# Patient Record
Sex: Male | Born: 1950 | Race: Black or African American | Hispanic: No | Marital: Single
Health system: Southern US, Community
[De-identification: ages and names within clinical notes are randomized; demographics above are authoritative.]

## PROBLEM LIST (undated history)

## (undated) DIAGNOSIS — G4700 Insomnia, unspecified: Secondary | ICD-10-CM

## (undated) DIAGNOSIS — R2681 Unsteadiness on feet: Secondary | ICD-10-CM

## (undated) DIAGNOSIS — D61818 Other pancytopenia: Secondary | ICD-10-CM

## (undated) DIAGNOSIS — I6529 Occlusion and stenosis of unspecified carotid artery: Secondary | ICD-10-CM

## (undated) DIAGNOSIS — I10 Essential (primary) hypertension: Secondary | ICD-10-CM

## (undated) DIAGNOSIS — E119 Type 2 diabetes mellitus without complications: Secondary | ICD-10-CM

## (undated) DIAGNOSIS — I639 Cerebral infarction, unspecified: Secondary | ICD-10-CM

---

## 2021-02-09 ENCOUNTER — Emergency Department (HOSPITAL_COMMUNITY): Payer: Medicare Other

## 2021-02-09 ENCOUNTER — Other Ambulatory Visit: Payer: Self-pay

## 2021-02-09 ENCOUNTER — Encounter (HOSPITAL_COMMUNITY): Payer: Self-pay

## 2021-02-09 ENCOUNTER — Emergency Department (HOSPITAL_COMMUNITY)
Admission: EM | Admit: 2021-02-09 | Discharge: 2021-02-09 | Disposition: A | Discharge status: Home / Self Care | Payer: Medicare Other | Attending: Emergency Medicine | Admitting: Emergency Medicine

## 2021-02-09 DIAGNOSIS — K5909 Other constipation: Secondary | ICD-10-CM

## 2021-02-09 DIAGNOSIS — Z7902 Long term (current) use of antithrombotics/antiplatelets: Secondary | ICD-10-CM | POA: Insufficient documentation

## 2021-02-09 DIAGNOSIS — Y92009 Unspecified place in unspecified non-institutional (private) residence as the place of occurrence of the external cause: Secondary | ICD-10-CM | POA: Diagnosis not present

## 2021-02-09 DIAGNOSIS — S2242XA Multiple fractures of ribs, left side, initial encounter for closed fracture: Secondary | ICD-10-CM | POA: Insufficient documentation

## 2021-02-09 DIAGNOSIS — Z23 Encounter for immunization: Secondary | ICD-10-CM | POA: Diagnosis not present

## 2021-02-09 DIAGNOSIS — R519 Headache, unspecified: Secondary | ICD-10-CM | POA: Insufficient documentation

## 2021-02-09 DIAGNOSIS — S20212A Contusion of left front wall of thorax, initial encounter: Secondary | ICD-10-CM

## 2021-02-09 DIAGNOSIS — S299XXA Unspecified injury of thorax, initial encounter: Secondary | ICD-10-CM | POA: Diagnosis present

## 2021-02-09 DIAGNOSIS — I10 Essential (primary) hypertension: Secondary | ICD-10-CM | POA: Insufficient documentation

## 2021-02-09 DIAGNOSIS — W01198A Fall on same level from slipping, tripping and stumbling with subsequent striking against other object, initial encounter: Secondary | ICD-10-CM | POA: Diagnosis not present

## 2021-02-09 DIAGNOSIS — S0083XA Contusion of other part of head, initial encounter: Secondary | ICD-10-CM

## 2021-02-09 DIAGNOSIS — R0789 Other chest pain: Secondary | ICD-10-CM | POA: Insufficient documentation

## 2021-02-09 DIAGNOSIS — K469 Unspecified abdominal hernia without obstruction or gangrene: Secondary | ICD-10-CM | POA: Diagnosis not present

## 2021-02-09 DIAGNOSIS — W010XXA Fall on same level from slipping, tripping and stumbling without subsequent striking against object, initial encounter: Secondary | ICD-10-CM

## 2021-02-09 DIAGNOSIS — K4090 Unilateral inguinal hernia, without obstruction or gangrene, not specified as recurrent: Secondary | ICD-10-CM

## 2021-02-09 DIAGNOSIS — E119 Type 2 diabetes mellitus without complications: Secondary | ICD-10-CM | POA: Diagnosis not present

## 2021-02-09 DIAGNOSIS — S01312A Laceration without foreign body of left ear, initial encounter: Secondary | ICD-10-CM | POA: Insufficient documentation

## 2021-02-09 DIAGNOSIS — R030 Elevated blood-pressure reading, without diagnosis of hypertension: Secondary | ICD-10-CM

## 2021-02-09 HISTORY — DX: Other pancytopenia: D61.818

## 2021-02-09 HISTORY — DX: Cerebral infarction, unspecified: I63.9

## 2021-02-09 HISTORY — DX: Unsteadiness on feet: R26.81

## 2021-02-09 HISTORY — DX: Type 2 diabetes mellitus without complications: E11.9

## 2021-02-09 HISTORY — DX: Essential (primary) hypertension: I10

## 2021-02-09 HISTORY — DX: Occlusion and stenosis of unspecified carotid artery: I65.29

## 2021-02-09 HISTORY — DX: Insomnia, unspecified: G47.00

## 2021-02-09 LAB — CBC
HCT: 31 % — ABNORMAL LOW (ref 39.0–52.0)
Hemoglobin: 9.9 g/dL — ABNORMAL LOW (ref 13.0–17.0)
MCH: 32.5 pg (ref 26.0–34.0)
MCHC: 31.9 g/dL (ref 30.0–36.0)
MCV: 101.6 fL — ABNORMAL HIGH (ref 80.0–100.0)
Platelets: 255 10*3/uL (ref 150–400)
RBC: 3.05 MIL/uL — ABNORMAL LOW (ref 4.22–5.81)
RDW: 14.7 % (ref 11.5–15.5)
WBC: 4.6 10*3/uL (ref 4.0–10.5)
nRBC: 0 % (ref 0.0–0.2)

## 2021-02-09 LAB — COMPREHENSIVE METABOLIC PANEL
ALT: 16 U/L (ref 0–44)
AST: 19 U/L (ref 15–41)
Albumin: 3.2 g/dL — ABNORMAL LOW (ref 3.5–5.0)
Alkaline Phosphatase: 86 U/L (ref 38–126)
Anion gap: 8 (ref 5–15)
BUN: 15 mg/dL (ref 8–23)
CO2: 22 mmol/L (ref 22–32)
Calcium: 9.5 mg/dL (ref 8.9–10.3)
Chloride: 108 mmol/L (ref 98–111)
Creatinine, Ser: 0.82 mg/dL (ref 0.61–1.24)
GFR, Estimated: >60 mL/min (ref >60)
Glucose, Bld: 102 mg/dL — ABNORMAL HIGH (ref 70–99)
Potassium: 4.1 mmol/L (ref 3.5–5.1)
Sodium: 138 mmol/L (ref 135–145)
Total Bilirubin: 0.5 mg/dL (ref 0.3–1.2)
Total Protein: 7.1 g/dL (ref 6.5–8.1)

## 2021-02-09 LAB — TROPONIN I (HIGH SENSITIVITY): Troponin I (High Sensitivity): 7 ng/L (ref <18)

## 2021-02-09 MED ORDER — IOHEXOL 350 MG/ML SOLN
80.0000 mL | Freq: Once | INTRAVENOUS | Status: AC | PRN
  Administered 2021-02-09: 80 mL via INTRAVENOUS

## 2021-02-09 MED ORDER — TETANUS-DIPHTH-ACELL PERTUSSIS 5-2.5-18.5 LF-MCG/0.5 IM SUSY
0.5000 mL | PREFILLED_SYRINGE | Freq: Once | INTRAMUSCULAR | Status: AC
  Administered 2021-02-09: 0.5 mL via INTRAMUSCULAR
  Filled 2021-02-09: qty 0.5

## 2021-02-09 NOTE — ED Provider Notes (Addendum)
[facility] EMERGENCY DEPARTMENT Provider Note   CSN: [id number] Arrival date & time: 02/09/21  0809     History Chief Complaint  Patient presents with   Chris Perkins is a 70 y.o. male.  Patient c/o fall at home this AM. Per report, had gotten up, ?mid chest pain then, and tripped, fell forward, hitting forehead. No faintness or dizziness prior to fall. No loc w fall. Contusion to left forehead, mild dull pain to area, non radiating. Also contusion w small laceration left ear. Last tetanus unknown. Denies neck or back pain. No current chest pain or discomfort. No sob. No abd pain or nv. Denies focal extremity pain or injury. Is on plavix.   The history is provided by the patient and the EMS personnel.  Fall Pertinent negatives include no chest pain, no abdominal pain and no shortness of breath.      Past Medical History:  Diagnosis Date   Diabetes mellitus without complication (HCC)    Hypertension    Insomnia    Pancytopenia (HCC)    Stenosis of carotid artery    Stroke (HCC)    Unsteady gait     There are no problems to display for this patient.   History reviewed. No pertinent surgical history.     History reviewed. No pertinent family history.  Social History   Tobacco Use   Smoking status: Unknown  Substance Use Topics   Drug use: Not Currently    Home Medications Prior to Admission medications   Not on File    Allergies    Patient has no known allergies.  Review of Systems   Review of Systems  Constitutional:  Negative for fever.  HENT:  Negative for nosebleeds.   Eyes:  Negative for pain and visual disturbance.  Respiratory:  Negative for cough and shortness of breath.   Cardiovascular:  Negative for chest pain and leg swelling.  Gastrointestinal:  Negative for abdominal pain, diarrhea and vomiting.  Genitourinary:  Negative for dysuria and flank pain.  Musculoskeletal:  Negative for back pain and neck pain.   Skin:  Positive for wound. Negative for rash.  Neurological:  Negative for weakness and numbness.  Hematological:        On plavix, no recent abnormal bruising or bleeding  Psychiatric/Behavioral:  Negative for confusion.    Physical Exam Updated Vital Signs BP (!) 149/74   Pulse 62   Temp (!) 97.1 F (36.2 C) (Temporal)   Resp 18   Ht 1.778 m (5\' 10" )   Wt 72.6 kg   SpO2 97%   BMI 22.96 kg/m   Physical Exam Vitals and nursing note reviewed.  Constitutional:      Appearance: Normal appearance. He is well-developed.  HENT:     Head:     Comments: Contusion left forehead. Contusion left ear with small, superficial laceration ~ 5 mm. No auricular hematoma.     Nose: Nose normal.     Mouth/Throat:     Mouth: Mucous membranes are moist.     Pharynx: Oropharynx is clear.  Eyes:     General: No scleral icterus.    Conjunctiva/sclera: Conjunctivae normal.     Pupils: Pupils are equal, round, and reactive to light.  Neck:     Vascular: No carotid bruit.     Trachea: No tracheal deviation.  Cardiovascular:     Rate and Rhythm: Normal rate and regular rhythm.     Pulses: Normal pulses.  Heart sounds: Normal heart sounds. No murmur heard.   No friction rub. No gallop.  Pulmonary:     Effort: Pulmonary effort is normal. No accessory muscle usage or respiratory distress.     Breath sounds: Normal breath sounds.     Comments: Mild left chest wall tenderness (but not as tender as might expect if acute rib fxs), no crepitus, normal chest movement.  Chest:     Chest wall: Tenderness present.  Abdominal:     General: Bowel sounds are normal. There is no distension.     Palpations: Abdomen is soft. There is no mass.     Tenderness: There is abdominal tenderness. There is no guarding or rebound.     Hernia: A hernia is present.     Comments: Mild epigastric tenderness. No abd distension. Large RIH, soft, not tense or tender, not painful.   Genitourinary:    Comments: No cva  tenderness. Musculoskeletal:        General: No swelling or tenderness.     Cervical back: Normal range of motion and neck supple. No rigidity.     Comments: CTLS spine, non tender, aligned, no step off. Good rom bil extremities without pain or focal bony tenderness.   Skin:    General: Skin is warm and dry.     Findings: No rash.  Neurological:     Mental Status: He is alert.     Comments: Alert, speech clear. Moves bil ext purposefully. Left weakness c/w prior cva/baseline.   Psychiatric:        Mood and Affect: Mood normal.    ED Results / Procedures / Treatments   Labs (all labs ordered are listed, but only abnormal results are displayed) Results for orders placed or performed during the hospital encounter of 02/09/21  CBC  Result Value Ref Range   WBC 4.6 4.0 - 10.5 K/uL   RBC 3.05 (L) 4.22 - 5.81 MIL/uL   Hemoglobin 9.9 (L) 13.0 - 17.0 g/dL   HCT 35.5 (L) 73.2 - 20.2 %   MCV 101.6 (H) 80.0 - 100.0 fL   MCH 32.5 26.0 - 34.0 pg   MCHC 31.9 30.0 - 36.0 g/dL   RDW 54.2 70.6 - 23.7 %   Platelets 255 150 - 400 K/uL   nRBC 0.0 0.0 - 0.2 %  Comprehensive metabolic panel  Result Value Ref Range   Sodium 138 135 - 145 mmol/L   Potassium 4.1 3.5 - 5.1 mmol/L   Chloride 108 98 - 111 mmol/L   CO2 22 22 - 32 mmol/L   Glucose, Bld 102 (H) 70 - 99 mg/dL   BUN 15 8 - 23 mg/dL   Creatinine, Ser 6.28 0.61 - 1.24 mg/dL   Calcium 9.5 8.9 - 31.5 mg/dL   Total Protein 7.1 6.5 - 8.1 g/dL   Albumin 3.2 (L) 3.5 - 5.0 g/dL   AST 19 15 - 41 U/L   ALT 16 0 - 44 U/L   Alkaline Phosphatase 86 38 - 126 U/L   Total Bilirubin 0.5 0.3 - 1.2 mg/dL   GFR, Estimated >17 >61 mL/min   Anion gap 8 5 - 15  Troponin I (High Sensitivity)  Result Value Ref Range   Troponin I (High Sensitivity) 7 <18 ng/L   CT Head Wo Contrast  Result Date: 02/09/2021 CLINICAL DATA:  Fall EXAM: CT HEAD WITHOUT CONTRAST TECHNIQUE: Contiguous axial images were obtained from the base of the skull through the vertex  without intravenous contrast. COMPARISON:  None. FINDINGS: Brain: There is atrophy and chronic small vessel disease changes. No acute intracranial abnormality. Specifically, no hemorrhage, hydrocephalus, mass lesion, acute infarction, or significant intracranial injury. Vascular: No hyperdense vessel or unexpected calcification. Skull: No acute calvarial abnormality. Sinuses/Orbits: No acute findings. Mucosal thickening throughout the left paranasal sinuses. Other: None IMPRESSION: Atrophy, chronic microvascular disease. No acute intracranial abnormality. Left chronic sinusitis. Electronically Signed   By: Charlett NoseKevin  Dover M.D.   On: 02/09/2021 09:19   CT Cervical Spine Wo Contrast  Result Date: 02/09/2021 CLINICAL DATA:  Fall EXAM: CT CERVICAL SPINE WITHOUT CONTRAST TECHNIQUE: Multidetector CT imaging of the cervical spine was performed without intravenous contrast. Multiplanar CT image reconstructions were also generated. COMPARISON:  None. FINDINGS: Alignment: Normal Skull base and vertebrae: No acute fracture. No primary bone lesion or focal pathologic process. Soft tissues and spinal canal: No prevertebral fluid or swelling. No visible canal hematoma. Disc levels: Degenerative disc disease, most pronounced at C5-6 and C6-7. Bilateral degenerative facet disease. Upper chest: No acute findings Other: None IMPRESSION: Degenerative disc and facet disease. No acute bony abnormality. Electronically Signed   By: Charlett NoseKevin  Dover M.D.   On: 02/09/2021 09:18   DG Chest Port 1 View  Result Date: 02/09/2021 CLINICAL DATA:  Pain EXAM: PORTABLE CHEST 1 VIEW COMPARISON:  None. FINDINGS: Low lung volumes. Bibasilar opacities. Left lateral 6th and 7th rib fractures noted. No visible effusion or pneumothorax. IMPRESSION: Low lung volumes with bibasilar atelectasis. Left lateral 6th and 7th rib fractures.  No visible pneumothorax. Electronically Signed   By: Charlett NoseKevin  Dover M.D.   On: 02/09/2021 09:00    EKG EKG  Interpretation  Date/Time:  Wednesday February 09 2021 08:11:18 EDT Ventricular Rate:  64 PR Interval:  166 QRS Duration: 84 QT Interval:  386 QTC Calculation: 399 R Axis:   59 Text Interpretation: Sinus rhythm Left ventricular hypertrophy No previous tracing Confirmed by Cathren LaineSteinl, Yaritsa Savarino ([id number]) on 02/09/2021 9:26:23 AM  Radiology CT Head Wo Contrast  Result Date: 02/09/2021 CLINICAL DATA:  Fall EXAM: CT HEAD WITHOUT CONTRAST TECHNIQUE: Contiguous axial images were obtained from the base of the skull through the vertex without intravenous contrast. COMPARISON:  None. FINDINGS: Brain: There is atrophy and chronic small vessel disease changes. No acute intracranial abnormality. Specifically, no hemorrhage, hydrocephalus, mass lesion, acute infarction, or significant intracranial injury. Vascular: No hyperdense vessel or unexpected calcification. Skull: No acute calvarial abnormality. Sinuses/Orbits: No acute findings. Mucosal thickening throughout the left paranasal sinuses. Other: None IMPRESSION: Atrophy, chronic microvascular disease. No acute intracranial abnormality. Left chronic sinusitis. Electronically Signed   By: Charlett NoseKevin  Dover M.D.   On: 02/09/2021 09:19   CT Cervical Spine Wo Contrast  Result Date: 02/09/2021 CLINICAL DATA:  Fall EXAM: CT CERVICAL SPINE WITHOUT CONTRAST TECHNIQUE: Multidetector CT imaging of the cervical spine was performed without intravenous contrast. Multiplanar CT image reconstructions were also generated. COMPARISON:  None. FINDINGS: Alignment: Normal Skull base and vertebrae: No acute fracture. No primary bone lesion or focal pathologic process. Soft tissues and spinal canal: No prevertebral fluid or swelling. No visible canal hematoma. Disc levels: Degenerative disc disease, most pronounced at C5-6 and C6-7. Bilateral degenerative facet disease. Upper chest: No acute findings Other: None IMPRESSION: Degenerative disc and facet disease. No acute bony abnormality.  Electronically Signed   By: Charlett NoseKevin  Dover M.D.   On: 02/09/2021 09:18   CT Abdomen Pelvis W Contrast  Result Date: 02/09/2021 CLINICAL DATA:  70 year old male with abdominal pain EXAM: CT ABDOMEN AND PELVIS WITH CONTRAST  TECHNIQUE: Multidetector CT imaging of the abdomen and pelvis was performed using the standard protocol following bolus administration of intravenous contrast. CONTRAST:  47mL OMNIPAQUE IOHEXOL 350 MG/ML SOLN COMPARISON:  Chest radiograph, concurrent FINDINGS: Lower chest: Coronary calcifications. Small volume left pleural effusion, with adjacent dependent atelectasis. Trace right pleural effusion. Hepatobiliary: Subcentimeter hypodensities within the inferior right hepatic lobe, too small to characterize. No gallstones, gallbladder wall thickening, or biliary dilatation. Pancreas: Unremarkable. No pancreatic ductal dilatation or surrounding inflammatory changes. Spleen: Normal in size without focal abnormality. Adrenals/Urinary Tract: 6 cm exophytic right midpole renal cyst. Smaller, some subcentimeter, bilateral renal cortical hypodensities are too small to adequately characterize but are likely additional cysts. No hydronephrosis. Stomach/Bowel: Nonobstructive small bowel. Cecal herniation into large right inguinal hernia. No evidence of vascular compromise. Nondilated appendix with radiodense appendicoliths. Nondilated colon. Rectal and sigmoid distension of stool Vascular/Lymphatic: Distal aortic and iliac atherosclerosis. No enlarged abdominal or pelvic nodes. Reproductive: Prostate is unremarkable. Other: Bladder herniation into left inguinal hernia. Larger right inguinal hernia, with mention, containing cecum and mesentery. Musculoskeletal: Multilevel left lower anterior, lateral and posterior rib fractures within varying degrees of healing. Multilevel degenerative changes of spine. Left lateral hip and flank soft tissues contusion. IMPRESSION: 1. Large right inguinal hernia with cecal and  mesenteric herniation. No evidence of bowel obstruction or vascular compromise. 2. Multilevel subacute and chronic left lower rib fractures. 3. Left hip and flank soft tissue contusions. Correlate with history of trauma. 4. Rectal and sigmoid distension of stool.  Correlate for impaction. 5. Additional chronic and senescent changes as above. Electronically Signed   By: Roanna Banning MD   On: 02/09/2021 12:16   DG Chest Port 1 View  Result Date: 02/09/2021 CLINICAL DATA:  Pain EXAM: PORTABLE CHEST 1 VIEW COMPARISON:  None. FINDINGS: Low lung volumes. Bibasilar opacities. Left lateral 6th and 7th rib fractures noted. No visible effusion or pneumothorax. IMPRESSION: Low lung volumes with bibasilar atelectasis. Left lateral 6th and 7th rib fractures.  No visible pneumothorax. Electronically Signed   By: Charlett Nose M.D.   On: 02/09/2021 09:00    Procedures .Marland KitchenLaceration Repair  Date/Time: 02/09/2021 10:56 AM Performed by: Cathren Laine, MD Authorized by: Cathren Laine, MD   Consent:    Consent given by:  Patient Laceration details:    Location:  Ear   Ear location:  L ear   Length (cm):  1 Pre-procedure details:    Preparation:  Patient was prepped and draped in usual sterile fashion Exploration:    Wound exploration: wound explored through full range of motion and entire depth of wound visualized     Contaminated: no   Treatment:    Area cleansed with:  Saline   Amount of cleaning:  Standard   Irrigation solution:  Sterile saline   Visualized foreign bodies/material removed: no   Skin repair:    Repair method:  Tissue adhesive Repair type:    Repair type:  Simple Post-procedure details:    Procedure completion:  Tolerated well, no immediate complications   Medications Ordered in ED Medications  Tdap (BOOSTRIX) injection 0.5 mL (0.5 mLs Intramuscular Given 02/09/21 1012)    ED Course  I have reviewed the triage vital signs and the nursing notes.  Pertinent labs & imaging results  that were available during my care of the patient were reviewed by me and considered in my medical decision making (see chart for details).    MDM Rules/Calculators/A&P  Iv ns. Continuous pulse ox and cardiac monitoring. Ecg. Pcxr. Stat labs.   Tetanus im.  Reviewed nursing notes and prior charts for additional history.   Labs reviewed/interpreted by me - chem normal. Trop normal.  CXR reviewed/interpreted by me - no pna. ?left rib fx.   CT reviewed/interpreted by me - no hem.   Recheck pt, no chest pain or discomfort. No sob.   Ear wound cleaned thoroughly w sterile saline/irrigated . Dermabond used to close small wounds.   Recheck, c/o mid to upper abd pain +tenderness. Imaging pending.   CT abd reviewed/interpreted by me - no sbo. Large RIH without acute findings.   Recheck, pt comfortable, pt requests d/c. On recheck abd soft, nt/nd, no pain at hernia site, soft, not tense or tender.  Pt currently appears stable for d/c.   Rec pcp f/u.     Final Clinical Impression(s) / ED Diagnoses Final diagnoses:  None    Rx / DC Orders ED Discharge Orders     None         Cathren Laine, MD 02/09/21 1248

## 2021-02-09 NOTE — ED Notes (Signed)
Patient transported to CT 

## 2021-02-09 NOTE — ED Triage Notes (Signed)
Pt BIB [facility] from [facility] for a mechanical fall at approx 0700. Pt has a hx of stroke with Left side deficits. Prior to his fall staff recalls pt was calling out with CP. Pt was walking around on his bed when he slipped and fell landing on the floor striking Left side of his head. Pt presents with hematoma above Left eye, abrasion to his Left ear and c/o Left leg/knee pain. Per EMS pt was lying on his Left side upon their arrival. Pt is confused at baseline, he is alert to self and location, per staff that is his norm.  Per facility pt is taking a blood thinner.  BP 170/92 HR 68 RR 14 93% RA  CBG 101  20g RAC

## 2021-02-09 NOTE — ED Notes (Signed)
Pt removed his c-collar, refuses to put it back on.

## 2021-02-09 NOTE — Discharge Instructions (Addendum)
It was our pleasure to provide your ER care today - we hope that you feel better.  Fall precautions.   Your xrays show left rib fractures. Dermabond/wound adhesive was used to close your small earlobe lacerations. Take acetaminophen as need for pain.   You abdominal scan shows 'Large right inguinal hernia with cecal and mesenteric herniation.  No evidence of bowel obstruction or vascular compromise', as well as constipation.  For hernia, follow up with general surgeon in the next couple weeks - call office to arrange appointment. Return to ER if area becomes acutely painful or more swollen.  For constipation, make sure to stay well hydrated/drink plenty of fluids, get adequate fiber in diet, and take colace (stool softener) 2x/day and take miralax (laxative) once a day as need.   Follow up with primary care doctor in the next couple weeks - have your blood pressure rechecked then, as it is high today.   Return to ER if worse, new symptoms, fevers, new/severe pain, chest pain, trouble breathing, or other concern.

## 2021-02-09 NOTE — ED Notes (Signed)
This RN trying to assist pt with urinal, he completely missed the urinal. Candise Bowens RN assisted this RN with cleaning pt and full linen change

## 2021-10-29 ENCOUNTER — Encounter (HOSPITAL_COMMUNITY): Payer: Self-pay | Admitting: Emergency Medicine

## 2021-10-29 ENCOUNTER — Emergency Department (HOSPITAL_COMMUNITY): Payer: Medicare Other

## 2021-10-29 ENCOUNTER — Other Ambulatory Visit: Payer: Self-pay

## 2021-10-29 ENCOUNTER — Emergency Department (HOSPITAL_COMMUNITY)
Admission: EM | Admit: 2021-10-29 | Discharge: 2021-10-29 | Disposition: A | Discharge status: Skilled Nursing Facility | Payer: Medicare Other | Attending: Emergency Medicine | Admitting: Emergency Medicine

## 2021-10-29 DIAGNOSIS — Z7902 Long term (current) use of antithrombotics/antiplatelets: Secondary | ICD-10-CM | POA: Diagnosis not present

## 2021-10-29 DIAGNOSIS — M79652 Pain in left thigh: Secondary | ICD-10-CM | POA: Insufficient documentation

## 2021-10-29 DIAGNOSIS — Z7982 Long term (current) use of aspirin: Secondary | ICD-10-CM | POA: Insufficient documentation

## 2021-10-29 DIAGNOSIS — G8929 Other chronic pain: Secondary | ICD-10-CM | POA: Diagnosis not present

## 2021-10-29 DIAGNOSIS — S80212A Abrasion, left knee, initial encounter: Secondary | ICD-10-CM | POA: Diagnosis not present

## 2021-10-29 DIAGNOSIS — S0001XA Abrasion of scalp, initial encounter: Secondary | ICD-10-CM

## 2021-10-29 DIAGNOSIS — S80219A Abrasion, unspecified knee, initial encounter: Secondary | ICD-10-CM

## 2021-10-29 DIAGNOSIS — S80211A Abrasion, right knee, initial encounter: Secondary | ICD-10-CM | POA: Diagnosis not present

## 2021-10-29 DIAGNOSIS — S0990XA Unspecified injury of head, initial encounter: Secondary | ICD-10-CM | POA: Diagnosis present

## 2021-10-29 DIAGNOSIS — W19XXXA Unspecified fall, initial encounter: Secondary | ICD-10-CM

## 2021-10-29 MED ORDER — ACETAMINOPHEN 325 MG PO TABS: 650.0000 mg | ORAL_TABLET | Freq: Once | ORAL | Status: DC

## 2021-10-29 NOTE — ED Notes (Addendum)
Patient BIB [facility] from [facility]. Pt was in wheelchair and started rolling and fell from wheel chair, hit left side of his head ( abrasion and swelling present). VSS. Oriented to baseline. NAD. ? ?

## 2021-10-29 NOTE — Discharge Instructions (Addendum)
Return to the ER if you develop new or worsening headache, vision changes, nausea, vomiting, confusion.  Head CT today was okay though there is a small chance you could develop bleeding in or around your brain due to the Plavix use. ?

## 2021-10-29 NOTE — ED Notes (Signed)
[facility] arrived for transport at this time 

## 2021-10-29 NOTE — ED Notes (Signed)
Attempted to call [facility], no response. Will attempt at a later time.  ?

## 2021-10-29 NOTE — ED Notes (Signed)
Received verbal report from Bailey B RN at this time 

## 2021-10-29 NOTE — ED Notes (Signed)
[facility] called 

## 2021-10-29 NOTE — ED Notes (Addendum)
Hematoma noted to lt side of forehead x 2 from fall earlier today. Abrasions noted to bilat knees ?

## 2021-10-29 NOTE — ED Notes (Signed)
Attempted to call [facility] to provide verbal report however there was no answer ?

## 2021-10-29 NOTE — ED Notes (Signed)
Patient transported to CT 

## 2021-10-29 NOTE — Progress Notes (Signed)
Orthopedic Tech Progress Note ?Patient Details:  ?Yogi Colla ?[birth year] ?[id number] ?Level 2 Trauma  ?Patient ID: Chantry Torchio, male   DOB: 11-06-1950, 71 y.o.   MRN: [id number] ? ?Skarleth Delmonico E Syris Brookens ?10/29/2021, 6:53 PM ? ?

## 2021-10-29 NOTE — ED Provider Notes (Signed)
?[facility] EMERGENCY DEPARTMENT ?Provider Note ? ? ?CSN: [id number] ?Arrival date & time:    ? ?  ? ?History ? ?Chief Complaint  ?Patient presents with  ? Fall  ? ? ?Chris Perkins is a 71 y.o. male. ? ?HPI ?71 year old male presents after a fall out of his wheelchair.  History is initially from EMS who states that he was in the wheelchair and a started rolling and he fell off when it went over a curb.  Hit his head and has a small abrasion.  He is also complaining of bilateral knee pain.  No neck pain, chest pain, syncope.  He has 2 chronic swollen areas to his forehead that are not new but then it abrasion to the left temple area.  He is on Plavix.  This was a level 2 trauma.  Patient also endorses chronic pain in the left thigh. ? ?Home Medications ?Prior to Admission medications   ?Medication Sig Start Date End Date Taking? Authorizing Provider  ?acetaminophen (TYLENOL) 500 MG tablet Take 1,000 mg by mouth in the morning and at bedtime.    [provider]  ?amLODipine (NORVASC) 10 MG tablet Take 10 mg by mouth daily. 01/17/21   [provider]  ?aspirin 81 MG chewable tablet Chew 81 mg by mouth daily.    [provider]  ?cholecalciferol (VITAMIN D) 25 MCG (1000 UNIT) tablet Take 2,000 Units by mouth daily.    [provider]  ?clopidogrel (PLAVIX) 75 MG tablet Take 75 mg by mouth daily. 01/17/21   [provider]  ?folic acid (FOLVITE) 1 MG tablet Take 1 mg by mouth daily. 01/17/21   [provider]  ?gabapentin (NEURONTIN) 300 MG capsule Take 300 mg by mouth 3 (three) times daily. 01/21/21   [provider]  ?metoprolol succinate (TOPROL-XL) 100 MG 24 hr tablet Take 100 mg by mouth daily. 01/17/21   [provider]  ?Multiple Vitamin (MULTIVITAMIN) tablet Take 1 tablet by mouth daily.    [provider]  ?pantoprazole (PROTONIX) 40 MG tablet Take 40 mg by mouth daily. 12/24/20   [provider]   ?sertraline (ZOLOFT) 25 MG tablet Take 25 mg by mouth daily. 01/20/21   [provider]  ?thiamine 100 MG tablet Take 100 mg by mouth daily. 01/19/21   [provider]  ?traZODone (DESYREL) 50 MG tablet Take 50 mg by mouth at bedtime. 01/23/21   [provider]  ?   ? ?Allergies    ?Patient has no known allergies.   ? ?Review of Systems   ?Review of Systems  ?Respiratory:  Negative for shortness of breath.   ?Cardiovascular:  Negative for chest pain.  ?Musculoskeletal:  Positive for arthralgias. Negative for neck pain.  ?Skin:  Positive for wound.  ?Neurological:  Negative for syncope and headaches.  ? ?Physical Exam ?Updated Vital Signs ?BP (!) 165/95   Pulse 71   Temp 98.1 ?F (36.7 ?C)   Resp 19   Ht 5\' 10"  (1.778 m)   Wt 73 kg   SpO2 98%   BMI 23.09 kg/m?  ?Physical Exam ?Vitals and nursing note reviewed.  ?Constitutional:   ?   Appearance: He is well-developed.  ?HENT:  ?   Head: Normocephalic.  ? ?Eyes:  ?   Pupils: Pupils are equal, round, and reactive to light.  ?Cardiovascular:  ?   Rate and Rhythm: Normal rate and regular rhythm.  ?   Pulses:     ?  Posterior tibial pulses are 2+ on the right side and 2+ on the left side.  ?   Heart sounds: Normal heart sounds.  ?Pulmonary:  ?   Effort: Pulmonary effort is normal.  ?   Breath sounds: Normal breath sounds.  ?Abdominal:  ?   Palpations: Abdomen is soft.  ?   Tenderness: There is no abdominal tenderness.  ?Musculoskeletal:  ?   Cervical back: No spinous process tenderness or muscular tenderness.  ?   Left hip: No tenderness.  ?   Right knee: No effusion. Tenderness present.  ?   Left knee: No effusion. Tenderness present.  ?   Comments: Small abrasions to both knees  ?Skin: ?   General: Skin is warm and dry.  ?Neurological:  ?   Mental Status: He is alert.  ? ? ?ED Results / Procedures / Treatments   ?Labs ?(all labs ordered are listed, but only abnormal results are displayed) ?Labs Reviewed - No data to display ? ?EKG ?EKG  Interpretation ? ?Date/Time:  Saturday October 29 2021 18:28:25 EDT ?Ventricular Rate:  71 ?PR Interval:  172 ?QRS Duration: 80 ?QT Interval:  374 ?QTC Calculation: 407 ?R Axis:   51 ?Text Interpretation: Sinus rhythm Atrial premature complex Anteroseptal infarct, old nonspecific T waves similar to Aug 2022 Confirmed by Pricilla LovelessGoldston, Shinita Mac [phone]([id number]) on 10/29/2021 6:40:03 PM ? ?Radiology ?DG Pelvis 1-2 Views ? ?Result Date: 10/29/2021 ?CLINICAL DATA:  Fall.  Fall from wheelchair. EXAM: PELVIS - 1-2 VIEW COMPARISON:  None. FINDINGS: The bilateral sacroiliac common bilateral femoroacetabular and pubic symphysis joint spaces are maintained. Mild superolateral bilateral acetabular degenerative osteophytosis. No acute fracture is seen. No dislocation. A large amount of stool is seen within the rectum. Vascular calcifications. IMPRESSION: No acute fracture. High-grade stool within the rectum. Electronically Signed   By: Neita Garnetonald  Viola M.D.   On: 10/29/2021 18:39  ? ?CT Head Wo Contrast ? ?Result Date: 10/29/2021 ?CLINICAL DATA:  Head trauma, minor (Age >= 65y); Neck trauma (Age >= 65y) EXAM: CT HEAD WITHOUT CONTRAST CT CERVICAL SPINE WITHOUT CONTRAST TECHNIQUE: Multidetector CT imaging of the head and cervical spine was performed following the standard protocol without intravenous contrast. Multiplanar CT image reconstructions of the cervical spine were also generated. RADIATION DOSE REDUCTION: This exam was performed according to the departmental dose-optimization program which includes automated exposure control, adjustment of the mA and/or kV according to patient size and/or use of iterative reconstruction technique. COMPARISON:  02/09/2021 FINDINGS: CT HEAD FINDINGS Brain: No evidence of acute infarction, hemorrhage, hydrocephalus, extra-axial collection or mass lesion/mass effect. Extensive low-density changes within the periventricular and subcortical white matter compatible with chronic microvascular ischemic change. Moderate  diffuse cerebral volume loss. Vascular: Atherosclerotic calcifications involving the large vessels of the skull base. No unexpected hyperdense vessel. Skull: Normal. Negative for fracture or focal lesion. Sinuses/Orbits: Left maxillary, left frontal, left ethmoid air cell mucosal thickening/opacification. Other: Scalp lipomas overlie the frontal bone at the midline and inferior left frontal region. 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 Facet joints are aligned without dislocation or traumatic listhesis. Skull base and vertebrae: No acute fracture. No primary bone lesion or focal pathologic process. Soft tissues and spinal canal: No prevertebral fluid or swelling. No visible canal hematoma. Disc levels: Similar degree moderate multilevel cervical spondylosis. Upper chest: Included lung apices are clear. Other: None. IMPRESSION: CT head: 1. No acute intracranial abnormality. 2. Chronic microvascular ischemic change and moderate diffuse cerebral volume loss. 3. Left-sided paranasal sinus disease. CT cervical  spine: 1. 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 2. No acute cervical spine fracture. 3. Similar degree of moderate multilevel cervical spondylosis. Electronically Signed   By: Duanne Guess D.O.   On: 10/29/2021 18:58  ? ?CT Cervical Spine Wo Contrast ? ?Result Date: 10/29/2021 ?CLINICAL DATA:  Head trauma, minor (Age >= 65y); Neck trauma (Age >= 65y) EXAM: CT HEAD WITHOUT CONTRAST CT CERVICAL SPINE WITHOUT CONTRAST TECHNIQUE: Multidetector CT imaging of the head and cervical spine was performed following the standard protocol without intravenous contrast. Multiplanar CT image reconstructions of the cervical spine were also generated. RADIATION DOSE REDUCTION: This exam was performed according to the departmental dose-optimization program which includes automated exposure control, adjustment of the mA and/or kV according to patient size  and/or use of iterative reconstruction technique. COMPARISON:  02/09/2021 FINDINGS: CT HEAD FINDINGS Brain: No evidence of acute infarction, hemorrhage, hydrocephalus, extra-axial collection or mass lesion/mass effect. Ext

## 2022-05-18 ENCOUNTER — Ambulatory Visit (INDEPENDENT_AMBULATORY_CARE_PROVIDER_SITE_OTHER): Payer: Medicare Other | Admitting: Podiatrist

## 2022-05-18 ENCOUNTER — Encounter: Payer: Self-pay | Admitting: Podiatrist

## 2022-05-18 DIAGNOSIS — M79674 Pain in right toe(s): Secondary | ICD-10-CM | POA: Diagnosis not present

## 2022-05-18 DIAGNOSIS — M79675 Pain in left toe(s): Secondary | ICD-10-CM | POA: Diagnosis not present

## 2022-05-18 DIAGNOSIS — B351 Tinea unguium: Secondary | ICD-10-CM

## 2022-05-18 NOTE — Patient Instructions (Signed)

## 2022-05-18 NOTE — Progress Notes (Signed)
Subjective: Chris Perkins is a 71 y.o. male patient with history of diabetes who presents to office today with a chief concern of long,mildly painful nails  while ambulating in shoes; unable to trim.  Patient denies any new cramping, numbness, burning or tingling in the legs.   Renford Dills, MD  is his primary care provider.  There are no problems to display for this patient.  Current Outpatient Medications on File Prior to Visit  Medication Sig Dispense Refill   acetaminophen (TYLENOL) 500 MG tablet Take 1,000 mg by mouth in the morning and at bedtime.     amLODipine (NORVASC) 10 MG tablet Take 10 mg by mouth daily.     aspirin 81 MG chewable tablet Chew 81 mg by mouth daily.     cholecalciferol (VITAMIN D) 25 MCG (1000 UNIT) tablet Take 2,000 Units by mouth daily.     clopidogrel (PLAVIX) 75 MG tablet Take 75 mg by mouth daily.     folic acid (FOLVITE) 1 MG tablet Take 1 mg by mouth daily.     gabapentin (NEURONTIN) 300 MG capsule Take 300 mg by mouth 3 (three) times daily.     metoprolol succinate (TOPROL-XL) 100 MG 24 hr tablet Take 100 mg by mouth daily.     Multiple Vitamin (MULTIVITAMIN) tablet Take 1 tablet by mouth daily.     pantoprazole (PROTONIX) 40 MG tablet Take 40 mg by mouth daily.     sertraline (ZOLOFT) 25 MG tablet Take 25 mg by mouth daily.     thiamine 100 MG tablet Take 100 mg by mouth daily.     traZODone (DESYREL) 50 MG tablet Take 50 mg by mouth at bedtime.     No current facility-administered medications on file prior to visit.   No Known Allergies    Objective: General: Patient is awake, alert, and oriented x 3 and in no acute distress.  Integument: Skin is warm, dry and supple bilateral. Nails are tender, long, thickened and  dystrophic with subungual debris, consistent with onychomycosis, 1-5 bilateral. No signs of infection. No open lesions or preulcerative lesions present bilateral. Remaining integument unremarkable.  Vasculature:  Dorsalis  Pedis pulse 1/4 bilateral. Posterior Tibial pulse  1/4 bilateral.  Capillary fill time <3 sec 1-5 bilateral. Temperature gradient within normal limits. No varicosities present bilateral. trace edema present left greater than right  Neurology: The patient has decreased sensation measured with a 5.07/10g Semmes Weinstein Monofilament at 1 /5 pedal sites intact bilateral . Vibratory sensation diminished bilateral with tuning fork. No Babinski sign present bilateral.   Musculoskeletal: No symptomatic pedal deformities noted bilateral. Brachymetatarsia fourth metatarsal left foot noted with mild contracture left fourth toe. Muscular strength 5/5 in all lower extremity muscular groups bilateral without pain on range of motion . No tenderness with calf compression bilateral.  Assessment and Plan:   ICD-10-CM   1. Pain due to onychomycosis of toenails of both feet  B35.1    M79.675    M79.674        -Examined patient. -Mechanically debrided all nails 1-5 bilateral using sterile nail nipper and filed with dremel without incident  -Answered all patient questions -Patient to return  in 3 months for at risk foot care -Patient advised to call the office if any problems or questions arise in the meantime.  Delories Heinz, DPM

## 2022-05-23 ENCOUNTER — Ambulatory Visit: Payer: Medicare Other | Admitting: Podiatry

## 2022-08-18 ENCOUNTER — Ambulatory Visit: Payer: Medicare Other | Admitting: Podiatry

## 2022-08-25 ENCOUNTER — Encounter: Payer: Self-pay | Admitting: Podiatry

## 2022-08-25 ENCOUNTER — Ambulatory Visit (INDEPENDENT_AMBULATORY_CARE_PROVIDER_SITE_OTHER): Payer: Medicare Other | Admitting: Podiatry

## 2022-08-25 DIAGNOSIS — D689 Coagulation defect, unspecified: Secondary | ICD-10-CM | POA: Diagnosis not present

## 2022-08-25 DIAGNOSIS — M79674 Pain in right toe(s): Secondary | ICD-10-CM | POA: Diagnosis not present

## 2022-08-25 DIAGNOSIS — B351 Tinea unguium: Secondary | ICD-10-CM | POA: Diagnosis not present

## 2022-08-25 DIAGNOSIS — M79675 Pain in left toe(s): Secondary | ICD-10-CM | POA: Diagnosis not present

## 2022-08-25 NOTE — Progress Notes (Signed)
  Subjective:  Patient ID: Chris Perkins, male    DOB: [birth year],   MRN: [id number]  No chief complaint on file.   72 y.o. male presents for concern of thickened elongated and painful nails that are difficult to trim. Requesting to have them trimmed today. Denies numbness or burning in his legs. He is on blood thinner and at risk for foot care.   PCP:  Seward Carol, MD    . Denies any other pedal complaints. Denies n/v/f/c.   Past Medical History:  Diagnosis Date   Diabetes mellitus without complication ([place])    Hypertension    Insomnia    Pancytopenia ([place])    Stenosis of carotid artery    Stroke ([facility])    Unsteady gait     Objective:  Physical Exam: Vascular: DP/PT pulses 1/4 bilateral. CFT <3 seconds. Absent hair growth on digits. Edema noted to bilateral lower extremities. Xerosis noted bilaterally.  Skin. No lacerations or abrasions bilateral feet. Nails 1-5 bilateral  are thickened discolored and elongated with subungual debris.  Musculoskeletal: MMT 5/5 bilateral lower extremities in DF, PF, Inversion and Eversion. Deceased ROM in DF of ankle joint. Brachymetatarsia noted on left foot with mild contracture of left fourth toe.  Neurological: Sensation intact to light touch. Protective sensation diminished bilateral.    Assessment:   1. Pain due to onychomycosis of toenails of both feet   2. Coagulation defect ([place])      Plan:  Patient was evaluated and treated and all questions answered. -Discussed and educated patient on  foot care, especially with  regards to the vascular, neurological and musculoskeletal systems.  -Discussed supportive shoes at all times and checking feet regularly.  -Mechanically debrided all nails 1-5 bilateral using sterile nail nipper and filed with dremel without incident  -Answered all patient questions -Patient to return  in 3 months for at risk foot care -Patient advised to call the office if any problems or questions arise in the  meantime.   Lorenda Peck, DPM

## 2022-09-22 IMAGING — DX DG KNEE COMPLETE 4+V*L*
4 series · 4 of 4 positions shown · non-contrast
Comparison: None.

CLINICAL DATA: Status post fall with knee pain.

EXAM:
LEFT KNEE - COMPLETE 4+ VIEW

[knee ap]
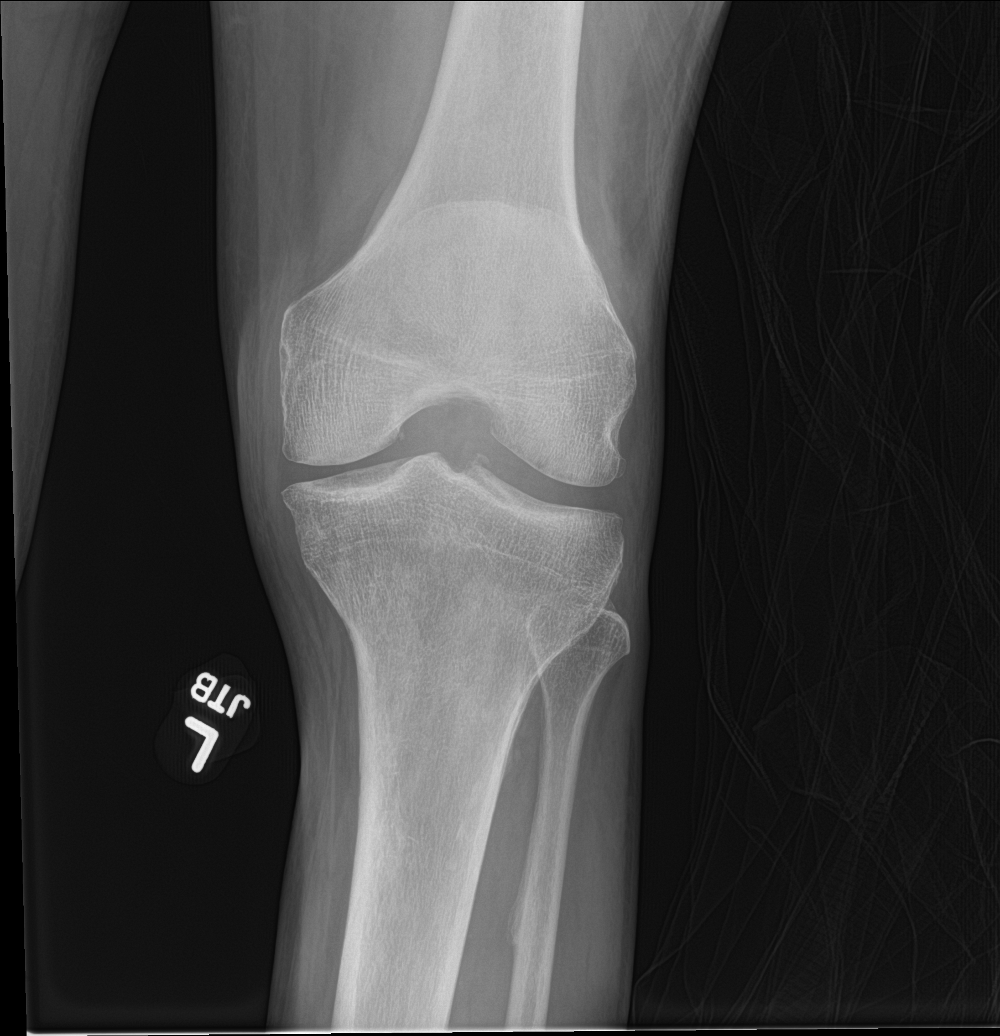

[knee tunnel]
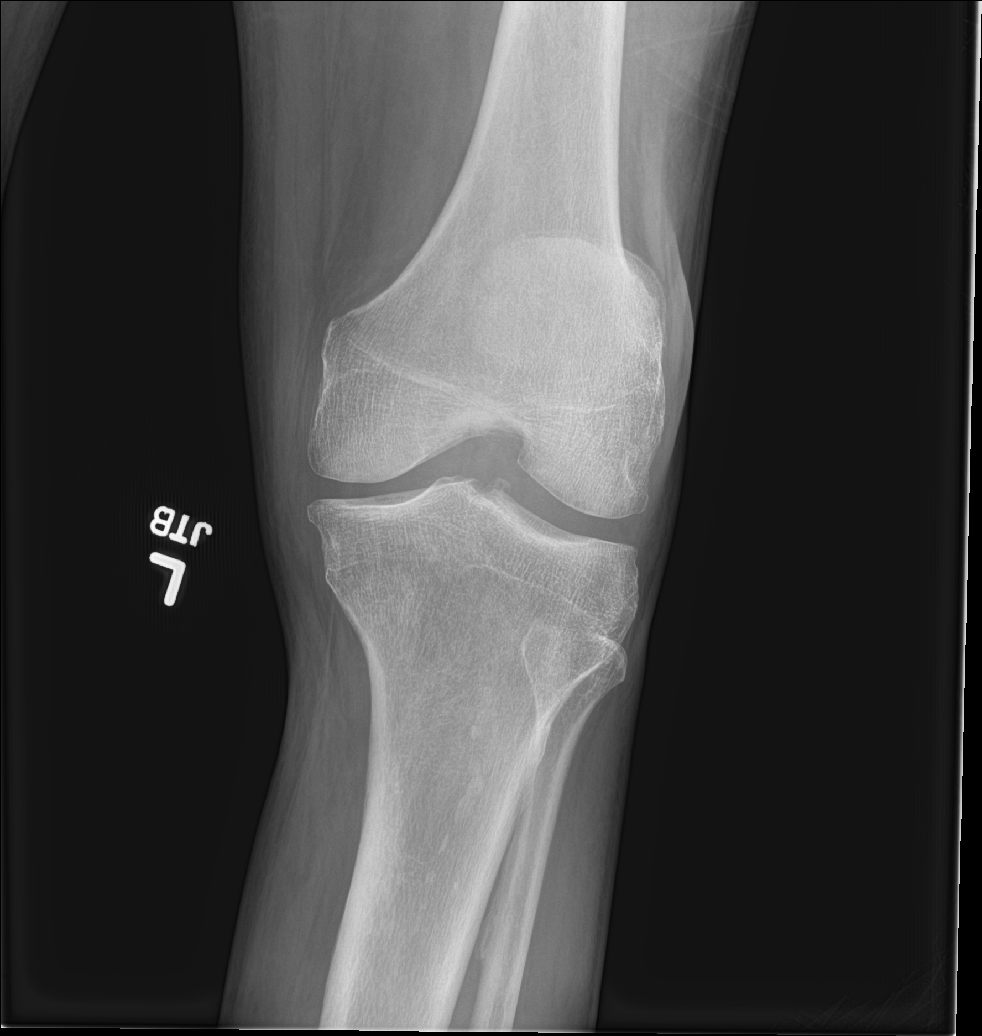

[knee lat]
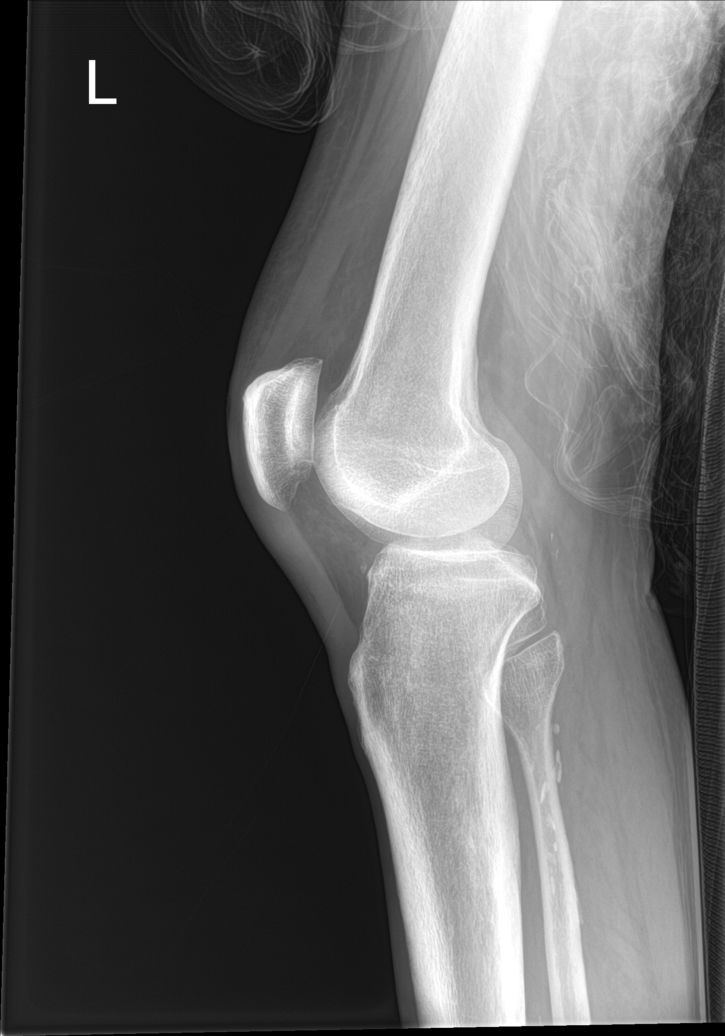

[knee obl]
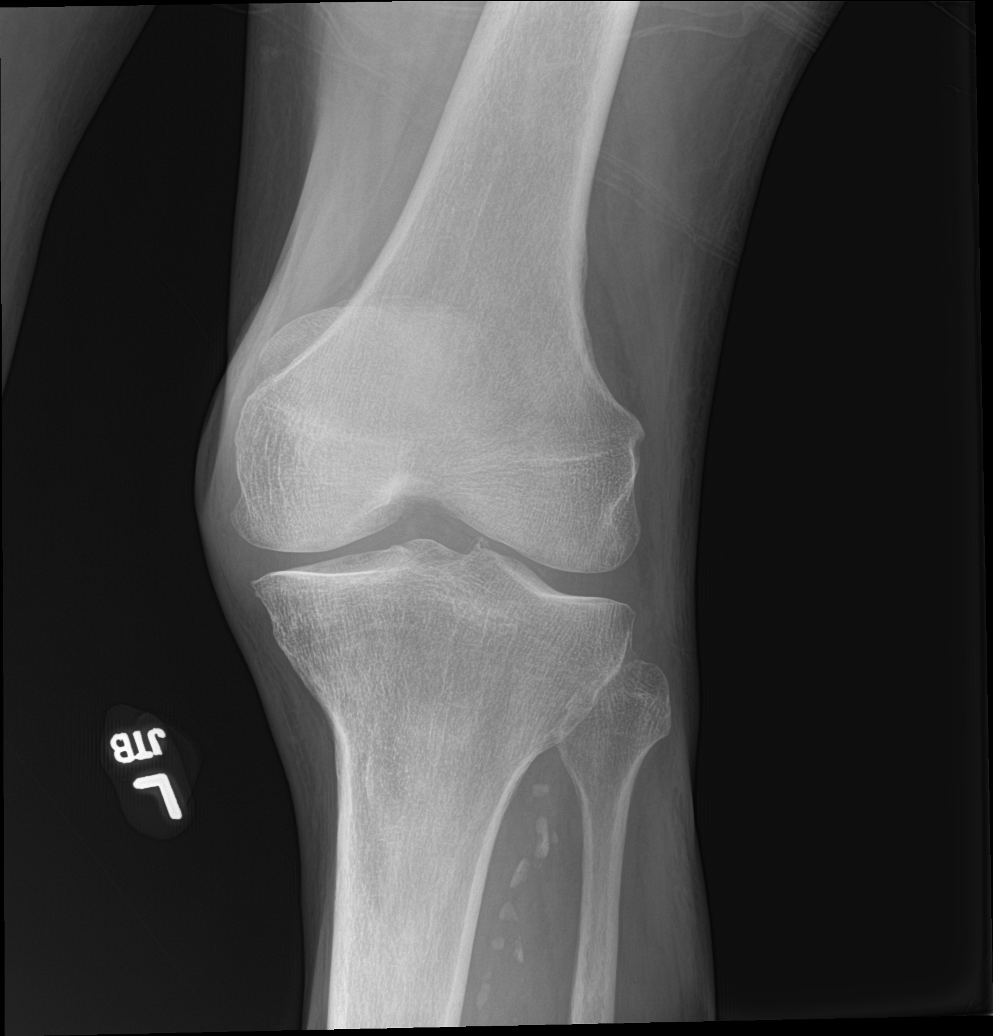

[4 of 4 positions shown; findings below may reference images not displayed]

FINDINGS: No evidence of fracture, dislocation, or joint effusion. No evidence
of arthropathy or other focal bone abnormality. Vascular
calcifications noted.
IMPRESSION: 1. No acute fracture or dislocation identified about the left knee.
2. Vascular calcifications.

## 2022-11-23 ENCOUNTER — Ambulatory Visit (INDEPENDENT_AMBULATORY_CARE_PROVIDER_SITE_OTHER): Payer: 59 | Admitting: Podiatry

## 2022-11-23 ENCOUNTER — Encounter: Payer: Self-pay | Admitting: Podiatry

## 2022-11-23 DIAGNOSIS — B351 Tinea unguium: Secondary | ICD-10-CM | POA: Diagnosis not present

## 2022-11-23 DIAGNOSIS — M79675 Pain in left toe(s): Secondary | ICD-10-CM | POA: Diagnosis not present

## 2022-11-23 DIAGNOSIS — D689 Coagulation defect, unspecified: Secondary | ICD-10-CM

## 2022-11-23 DIAGNOSIS — M79674 Pain in right toe(s): Secondary | ICD-10-CM

## 2022-11-23 NOTE — Progress Notes (Signed)
  Subjective:  Patient ID: Chris Perkins, male    DOB: 05/12/1951,   MRN: [id number]  No chief complaint on file.   72 y.o. male presents for concern of thickened elongated and painful nails that are difficult to trim. Requesting to have them trimmed today. Denies numbness or burning in his legs. He is on blood thinner and at risk for foot care.   PCP:  Polite, Tresten, MD    . Denies any other pedal complaints. Denies n/v/f/c.   Past Medical History:  Diagnosis Date   Diabetes mellitus without complication ([facility])    Hypertension    Insomnia    Pancytopenia ([facility])    Stenosis of carotid artery    Stroke ([facility])    Unsteady gait     Objective:  Physical Exam: Vascular: DP/PT pulses 1/4 bilateral. CFT <3 seconds. Absent hair growth on digits. Edema noted to bilateral lower extremities. Xerosis noted bilaterally.  Skin. No lacerations or abrasions bilateral feet. Nails 1-5 bilateral  are thickened discolored and elongated with subungual debris.  Musculoskeletal: MMT 5/5 bilateral lower extremities in DF, PF, Inversion and Eversion. Deceased ROM in DF of ankle joint. Brachymetatarsia noted on left foot with mild contracture of left fourth toe.  Neurological: Sensation intact to light touch. Protective sensation diminished bilateral.    Assessment:   1. Pain due to onychomycosis of toenails of both feet   2. Coagulation defect ([facility])      Plan:  Patient was evaluated and treated and all questions answered. -Discussed and educated patient on  foot care, especially with  regards to the vascular, neurological and musculoskeletal systems.  -Discussed supportive shoes at all times and checking feet regularly.  -Mechanically debrided all nails 1-5 bilateral using sterile nail nipper and filed with dremel without incident  -Answered all patient questions -Patient to return  in 3 months for at risk foot care -Patient advised to call the office if any problems or questions arise in the  meantime.   Labresha Mellor, DPM    

## 2023-02-21 ENCOUNTER — Emergency Department (HOSPITAL_COMMUNITY)
Admission: EM | Admit: 2023-02-21 | Discharge: 2023-02-22 | Disposition: A | Discharge status: Home / Self Care | Payer: Medicare Other | Attending: Emergency Medicine | Admitting: Emergency Medicine

## 2023-02-21 ENCOUNTER — Emergency Department (HOSPITAL_COMMUNITY): Payer: Medicare Other

## 2023-02-21 ENCOUNTER — Other Ambulatory Visit: Payer: Self-pay

## 2023-02-21 DIAGNOSIS — S0083XA Contusion of other part of head, initial encounter: Secondary | ICD-10-CM | POA: Diagnosis not present

## 2023-02-21 DIAGNOSIS — Z79899 Other long term (current) drug therapy: Secondary | ICD-10-CM | POA: Diagnosis not present

## 2023-02-21 DIAGNOSIS — W050XXA Fall from non-moving wheelchair, initial encounter: Secondary | ICD-10-CM | POA: Diagnosis not present

## 2023-02-21 DIAGNOSIS — S299XXA Unspecified injury of thorax, initial encounter: Secondary | ICD-10-CM | POA: Diagnosis present

## 2023-02-21 DIAGNOSIS — S80211A Abrasion, right knee, initial encounter: Secondary | ICD-10-CM | POA: Diagnosis not present

## 2023-02-21 DIAGNOSIS — S80212A Abrasion, left knee, initial encounter: Secondary | ICD-10-CM | POA: Diagnosis not present

## 2023-02-21 DIAGNOSIS — K029 Dental caries, unspecified: Secondary | ICD-10-CM

## 2023-02-21 DIAGNOSIS — E119 Type 2 diabetes mellitus without complications: Secondary | ICD-10-CM | POA: Insufficient documentation

## 2023-02-21 DIAGNOSIS — I10 Essential (primary) hypertension: Secondary | ICD-10-CM | POA: Diagnosis not present

## 2023-02-21 DIAGNOSIS — Y92129 Unspecified place in nursing home as the place of occurrence of the external cause: Secondary | ICD-10-CM | POA: Insufficient documentation

## 2023-02-21 DIAGNOSIS — S60411A Abrasion of left index finger, initial encounter: Secondary | ICD-10-CM | POA: Diagnosis not present

## 2023-02-21 DIAGNOSIS — W19XXXA Unspecified fall, initial encounter: Secondary | ICD-10-CM

## 2023-02-21 DIAGNOSIS — S2242XA Multiple fractures of ribs, left side, initial encounter for closed fracture: Secondary | ICD-10-CM | POA: Diagnosis not present

## 2023-02-21 DIAGNOSIS — Z7982 Long term (current) use of aspirin: Secondary | ICD-10-CM | POA: Diagnosis not present

## 2023-02-21 DIAGNOSIS — Y92009 Unspecified place in unspecified non-institutional (private) residence as the place of occurrence of the external cause: Secondary | ICD-10-CM

## 2023-02-21 LAB — CBC WITH DIFFERENTIAL/PLATELET
Abs Immature Granulocytes: 0.02 10*3/uL (ref 0.00–0.07)
Basophils Absolute: 0 10*3/uL (ref 0.0–0.1)
Basophils Relative: 1 %
Eosinophils Absolute: 0 10*3/uL (ref 0.0–0.5)
Eosinophils Relative: 1 %
HCT: 44.7 % (ref 39.0–52.0)
Hemoglobin: 14.3 g/dL (ref 13.0–17.0)
Immature Granulocytes: 1 %
Lymphocytes Relative: 51 %
Lymphs Abs: 2.2 10*3/uL (ref 0.7–4.0)
MCH: 32.6 pg (ref 26.0–34.0)
MCHC: 32 g/dL (ref 30.0–36.0)
MCV: 101.8 fL — ABNORMAL HIGH (ref 80.0–100.0)
Monocytes Absolute: 0.4 10*3/uL (ref 0.1–1.0)
Monocytes Relative: 8 %
Neutro Abs: 1.6 10*3/uL — ABNORMAL LOW (ref 1.7–7.7)
Neutrophils Relative %: 38 %
Platelets: 170 10*3/uL (ref 150–400)
RBC: 4.39 MIL/uL (ref 4.22–5.81)
RDW: 14.4 % (ref 11.5–15.5)
WBC: 4.3 10*3/uL (ref 4.0–10.5)
nRBC: 0 % (ref 0.0–0.2)

## 2023-02-21 LAB — COMPREHENSIVE METABOLIC PANEL
ALT: 24 U/L (ref 0–44)
AST: 26 U/L (ref 15–41)
Albumin: 3.3 g/dL — ABNORMAL LOW (ref 3.5–5.0)
Alkaline Phosphatase: 85 U/L (ref 38–126)
Anion gap: 11 (ref 5–15)
BUN: 15 mg/dL (ref 8–23)
CO2: 19 mmol/L — ABNORMAL LOW (ref 22–32)
Calcium: 8.8 mg/dL — ABNORMAL LOW (ref 8.9–10.3)
Chloride: 109 mmol/L (ref 98–111)
Creatinine, Ser: 0.94 mg/dL (ref 0.61–1.24)
GFR, Estimated: >60 mL/min (ref >60)
Glucose, Bld: 234 mg/dL — ABNORMAL HIGH (ref 70–99)
Potassium: 3.8 mmol/L (ref 3.5–5.1)
Sodium: 139 mmol/L (ref 135–145)
Total Bilirubin: 0.9 mg/dL (ref 0.3–1.2)
Total Protein: 6.8 g/dL (ref 6.5–8.1)

## 2023-02-21 LAB — URINALYSIS, ROUTINE W REFLEX MICROSCOPIC
Bilirubin Urine: NEGATIVE
Glucose, UA: 50 mg/dL — AB
Hgb urine dipstick: NEGATIVE
Ketones, ur: NEGATIVE mg/dL
Leukocytes,Ua: NEGATIVE
Nitrite: NEGATIVE
Protein, ur: NEGATIVE mg/dL
Specific Gravity, Urine: 1.026 (ref 1.005–1.030)
pH: 5 (ref 5.0–8.0)

## 2023-02-21 NOTE — ED Notes (Signed)
Left shoulder pain and left hand swelling noted as well. PERRLA.

## 2023-02-21 NOTE — ED Notes (Signed)
Pt to ct at this time.

## 2023-02-21 NOTE — ED Notes (Signed)
Pt has returned from CT.  

## 2023-02-21 NOTE — ED Provider Notes (Signed)
[facility] Provider Note   CSN: [id number] Arrival date & time: 02/21/23  1705     History  Chief Complaint  Patient presents with   Fall    Hx of strokes, coming from assisted living facility. Fall out of a w/c. On plavix. Abrasion to pts forehead. And to both knees. Hematoma above left eyebrow. Left arm contracted at baseline. Pt is  & O x 3. C collar in place. Lidocaine patch in place.     Chris Perkins is a 72 y.o. male with PMH as listed below who presents with fall at [facility].  Patient was in a wheelchair and fell on the asphalt, fell forward.  Unclear if he lost consciousness and patient does not remember what made him fall.  He denies any pain anywhere except his left hand.  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  He states that he walks without a wheelchair or walker.  He is in the assisted living portion of [facility] and has a roommate.  He has felt well recently and has no other complaints. EMS reports that facility noted strong malodorous urine smell.    Past Medical History:  Diagnosis Date   Diabetes mellitus without complication (HCC)    Hypertension    Insomnia    Pancytopenia (HCC)    Stenosis of carotid artery    Stroke (HCC)    Unsteady gait        Home Medications Prior to Admission medications   Medication Sig Start Date End Date Taking? Authorizing Provider  acetaminophen (TYLENOL) 500 MG tablet Take 1,000 mg by mouth in the morning and at bedtime.    [provider]  amLODipine (NORVASC) 10 MG tablet Take 10 mg by mouth daily. 01/17/21   [provider]  aspirin 81 MG chewable tablet Chew 81 mg by mouth daily.    [provider]  cholecalciferol (VITAMIN D) 25 MCG (1000 UNIT) tablet Take 2,000 Units by mouth daily.    [provider]  clopidogrel (PLAVIX) 75 MG tablet Take 75 mg by mouth daily. 01/17/21   [provider]  folic acid (FOLVITE) 1 MG tablet Take 1 mg by mouth daily. 01/17/21   [provider]  gabapentin (NEURONTIN) 300 MG capsule Take 300 mg by mouth 3 (three) times daily. 01/21/21   [provider]  metoprolol succinate (TOPROL-XL) 100 MG 24 hr tablet Take 100 mg by mouth daily. 01/17/21   [provider]  Multiple Vitamin (MULTIVITAMIN) tablet Take 1 tablet by mouth daily.    [provider]  pantoprazole (PROTONIX) 40 MG tablet Take 40 mg by mouth daily. 12/24/20   [provider]  sertraline (ZOLOFT) 25 MG tablet Take 25 mg by mouth daily. 01/20/21   [provider]  thiamine 100 MG tablet Take 100 mg by mouth daily. 01/19/21   [provider]  traZODone (DESYREL) 50 MG tablet Take 50 mg by mouth at bedtime. 01/23/21   [provider]      Allergies    Patient has no known allergies.    Review of Systems   Review of Systems A 10 point review of systems was performed and is negative unless otherwise reported in HPI.  Physical Exam Updated Vital Signs BP (!) 134/90   Pulse 68   Temp 98 F (36.7 C) (Oral)   Resp 18   Wt 79.4 kg  SpO2 99%   BMI 25.11 kg/m  Physical Exam  PRIMARY SURVEY  Airway Airway intact  Breathing Bilateral breath sounds  Circulation Carotid/femoral pulses 2+ intact bilaterally  GCS E =  4 V =  5 M =  6 Total = 15  Environment All clothes removed      SECONDARY SURVEY  Gen: -NAD  HEENT: -Head: NCAT. Scalp is clear of lacerations or wounds. Skull is clear of deformities or depressions -Forehead: Two 2x2 cm hematomas next to each other on L forehead with overlying abrasions. Stepoff deformity palpated on L eyebrow but it is nontender - patient states he has injured it in the past.  -Midface: Stable -Eyes: No visible injury to eyelids or eye, PERRLA 3mm bilaterally, EOMI -Nose: No gross deformities -Mouth: No injuries to lips, tongue or teeth. No trismus or  malposition -Ears: No auricular hematoma -Neck: Trachea is midline, no distended neck veins  Chest: -No tenderness, deformities, bruising or crepitus to clavicles or chest -Normal chest expansion -Normal heart sounds, S1/S2 normal, no m/r/g -No wheezes, rales, rhonchi  Abdomen: -No tenderness, bruising or penetrating injury  Pelvis: -Pelvis is stable and non-tender  Extremities: Right Upper Extremity: -Mild tenderness diffusely through R shoulder w/ no deformity or other signs of injury -Radial pulse intact RUE, cap refill good -Normal sensation -Normal ROM, good strength Left Upper Extremity: -Chronically contracted left upper extremity -Mild swelling and tenderness palpation diffusely through dorsal left hand without any deformities -Very small abrasion to dorsal second MCP -Radial pulse intact LUE, cap refill good -Normal sensation -Decreased range of motion/strength due to prior stroke Right Lower Extremity: -Abrasions to patella -No point tenderness, deformity or other signs of injury -DP intact RLE -Normal sensation -Normal ROM, good strength Left Lower Extremity: -Abrasions to patella -No point tenderness, deformity or other signs of injury -DP intact LLE -Normal sensation -Normal ROM, good strength  Back/Spine: -No midline C T or L spine tenderness or step-offs -Collar: EMS collar in place   Other: N/A     ED Results / Procedures / Treatments   Labs (all labs ordered are listed, but only abnormal results are displayed) Labs Reviewed  CBC WITH DIFFERENTIAL/PLATELET - Abnormal; Notable for the following components:      Result Value   MCV 101.8 (*)    Neutro Abs 1.6 (*)    All other components within normal limits  URINALYSIS, ROUTINE W REFLEX MICROSCOPIC - Abnormal; Notable for the following components:   Glucose, UA 50 (*)    All other components within normal limits  COMPREHENSIVE METABOLIC PANEL - Abnormal; Notable for the following components:   CO2 19  (*)    Glucose, Bld 234 (*)    Calcium 8.8 (*)    Albumin 3.3 (*)    All other components within normal limits    EKG EKG Interpretation Date/Time:  Wednesday February 21 2023 19:43:17 EDT Ventricular Rate:  68 PR Interval:  194 QRS Duration:  99 QT Interval:  377 QTC Calculation: 401 R Axis:   31  Text Interpretation: Sinus rhythm Nonspecific T abnormalities, lateral leads Confirmed by Vivi Barrack [phone]) on 02/21/2023 9:50:09 PM  Radiology DG Knee Right Port  Result Date: 02/21/2023 CLINICAL DATA:  Trauma/fall EXAM: PORTABLE RIGHT KNEE - 1-2 VIEW COMPARISON:  None Available. FINDINGS: No fracture or dislocation is seen. The joint spaces are preserved. Visualized soft tissues are within normal limits. No suprapatellar knee joint effusion. IMPRESSION: Negative. Electronically Signed   By: Roselie Awkward.D.  On: 02/21/2023 18:50   DG Knee Left Port  Result Date: 02/21/2023 CLINICAL DATA:  Trauma/fall EXAM: PORTABLE LEFT KNEE - 1-2 VIEW COMPARISON:  None Available. FINDINGS: No fracture or dislocation is seen. The joint spaces are preserved. Visualized soft tissues are within normal limits. No suprapatellar knee joint effusion. IMPRESSION: Negative. Electronically Signed   By: Charline Bills M.D.   On: 02/21/2023 18:50   DG Shoulder Right Port  Result Date: 02/21/2023 CLINICAL DATA:  Trauma/fall EXAM: RIGHT SHOULDER - 1 VIEW COMPARISON:  None Available. FINDINGS: No fracture or dislocation is seen. The joint spaces are preserved. The visualized soft tissues are unremarkable. Visualized right lung is clear. IMPRESSION: Negative. Electronically Signed   By: Charline Bills M.D.   On: 02/21/2023 18:49   DG Wrist Complete Left  Result Date: 02/21/2023 CLINICAL DATA:  Trauma/fall EXAM: LEFT WRIST - COMPLETE 3+ VIEW COMPARISON:  None Available. FINDINGS: Evaluation is limited by difficulty with patient positioning/obliquity. No definite fracture is seen. Mild degenerative changes  with radiocarpal narrowing. IMPRESSION: Limited evaluation. No definite fracture is seen. Electronically Signed   By: Charline Bills M.D.   On: 02/21/2023 18:49   DG Hand Complete Left  Result Date: 02/21/2023 CLINICAL DATA:  Trauma/fall EXAM: LEFT HAND - COMPLETE 3+ VIEW COMPARISON:  None Available. FINDINGS: Limited evaluation due to difficulty with patient position/obliquity. No definite fracture is seen. Moderate degenerative changes of the 1st carpometacarpal joint. IMPRESSION: Limited evaluation. No definite fracture is seen. Electronically Signed   By: Charline Bills M.D.   On: 02/21/2023 18:48   DG Pelvis Portable  Result Date: 02/21/2023 CLINICAL DATA:  Trauma/fall EXAM: PORTABLE PELVIS 1-2 VIEWS COMPARISON:  None Available. FINDINGS: No fracture or dislocation is seen. Bilateral hip joint spaces are preserved. Visualized bony pelvis appears intact. IMPRESSION: Negative. Electronically Signed   By: Charline Bills M.D.   On: 02/21/2023 18:47   DG Chest Port 1 View  Result Date: 02/21/2023 CLINICAL DATA:  Trauma/fall EXAM: PORTABLE CHEST 1 VIEW COMPARISON:  02/09/2021 FINDINGS: Low lung volumes with vascular crowding. Mild left basilar opacity, likely atelectasis. No pleural effusion or pneumothorax. The heart is normal in size. Suspected nondisplaced left lateral 6th and 7th rib fractures. IMPRESSION: Suspected nondisplaced left lateral 6th and 7th rib fractures. Correlate for point tenderness. No pneumothorax. Electronically Signed   By: Charline Bills M.D.   On: 02/21/2023 18:47   CT HEAD WO CONTRAST  Result Date: 02/21/2023 CLINICAL DATA:  Head trauma, moderate-severe; Facial trauma, blunt; Polytrauma, blunt EXAM: CT HEAD WITHOUT CONTRAST CT MAXILLOFACIAL WITHOUT CONTRAST CT CERVICAL SPINE WITHOUT CONTRAST TECHNIQUE: Multidetector CT imaging of the head, cervical spine, and maxillofacial structures were performed using the standard protocol without intravenous contrast.  Multiplanar CT image reconstructions of the cervical spine and maxillofacial structures were also generated. RADIATION DOSE REDUCTION: This exam was performed according to the departmental dose-optimization program which includes automated exposure control, adjustment of the mA and/or kV according to patient size and/or use of iterative reconstruction technique. COMPARISON:  CT Head 10/29/21 FINDINGS: CT HEAD FINDINGS Brain: No evidence of acute infarction, hemorrhage, hydrocephalus, extra-axial collection or mass lesion/mass effect. Sequela of moderate to severe chronic microvascular ischemic change. Chronic right MCA territory infarct involving the insular region. There is likely also a chronic infarct in the right thalamus. Vascular: No hyperdense vessel or unexpected calcification. Skull: Lipoma along the left frontal scalp. There is mild soft tissue swelling along the left frontal scalp. Other: None. CT MAXILLOFACIAL FINDINGS Osseous: Redemonstrated chronic  fracture of the superior orbital rim on the right (series 9, image 58). 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 Orbits: Negative. No traumatic or inflammatory finding. Sinuses: No middle ear or mastoid effusion. OMC pattern sinusitis on left with complete opacification of the left sphenoid sinus and near-complete opacification of the left maxillary and ethmoid sinuses. Soft tissues: Negative. CT CERVICAL SPINE FINDINGS Alignment: Trace anterolisthesis of C4 on C5 and trace retrolisthesis of C5 on C6. Skull base and vertebrae: No acute fracture. No primary bone lesion or focal pathologic process. Soft tissues and spinal canal: No prevertebral fluid or swelling. No visible canal hematoma. Disc levels:  No CT evidence of high-grade spinal canal stenosis. Upper chest: Negative. Other: None IMPRESSION: 1. No acute intracranial abnormality. 2. No acute facial bone fracture. 3. No acute fracture or traumatic listhesis  in the cervical spine. 4. OMC pattern sinusitis on the left. 5. 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 Electronically Signed   By: Lorenza Cambridge M.D.   On: 02/21/2023 17:59   CT MAXILLOFACIAL WO CONTRAST  Result Date: 02/21/2023 CLINICAL DATA:  Head trauma, moderate-severe; Facial trauma, blunt; Polytrauma, blunt EXAM: CT HEAD WITHOUT CONTRAST CT MAXILLOFACIAL WITHOUT CONTRAST CT CERVICAL SPINE WITHOUT CONTRAST TECHNIQUE: Multidetector CT imaging of the head, cervical spine, and maxillofacial structures were performed using the standard protocol without intravenous contrast. Multiplanar CT image reconstructions of the cervical spine and maxillofacial structures were also generated. RADIATION DOSE REDUCTION: This exam was performed according to the departmental dose-optimization program which includes automated exposure control, adjustment of the mA and/or kV according to patient size and/or use of iterative reconstruction technique. COMPARISON:  CT Head 10/29/21 FINDINGS: CT HEAD FINDINGS Brain: No evidence of acute infarction, hemorrhage, hydrocephalus, extra-axial collection or mass lesion/mass effect. Sequela of moderate to severe chronic microvascular ischemic change. Chronic right MCA territory infarct involving the insular region. There is likely also a chronic infarct in the right thalamus. Vascular: No hyperdense vessel or unexpected calcification. Skull: Lipoma along the left frontal scalp. There is mild soft tissue swelling along the left frontal scalp. Other: None. CT MAXILLOFACIAL FINDINGS Osseous: Redemonstrated chronic fracture of the superior orbital rim on the right (series 9, image 58). 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 Orbits: Negative. No traumatic or inflammatory finding. Sinuses: No middle ear or mastoid effusion. OMC pattern sinusitis on left with complete opacification of the left sphenoid  sinus and near-complete opacification of the left maxillary and ethmoid sinuses. Soft tissues: Negative. CT CERVICAL SPINE FINDINGS Alignment: Trace anterolisthesis of C4 on C5 and trace retrolisthesis of C5 on C6. Skull base and vertebrae: No acute fracture. No primary bone lesion or focal pathologic process. Soft tissues and spinal canal: No prevertebral fluid or swelling. No visible canal hematoma. Disc levels:  No CT evidence of high-grade spinal canal stenosis. Upper chest: Negative. Other: None IMPRESSION: 1. No acute intracranial abnormality. 2. No acute facial bone fracture. 3. No acute fracture or traumatic listhesis in the cervical spine. 4. OMC pattern sinusitis on the left. 5. 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 Electronically Signed   By: Lorenza Cambridge M.D.   On: 02/21/2023 17:59   CT CERVICAL SPINE WO CONTRAST  Result Date: 02/21/2023 CLINICAL DATA:  Head trauma, moderate-severe; Facial trauma, blunt; Polytrauma, blunt EXAM: CT HEAD WITHOUT CONTRAST CT MAXILLOFACIAL WITHOUT CONTRAST CT CERVICAL SPINE WITHOUT CONTRAST TECHNIQUE: Multidetector CT imaging of the head,  cervical spine, and maxillofacial structures were performed using the standard protocol without intravenous contrast. Multiplanar CT image reconstructions of the cervical spine and maxillofacial structures were also generated. RADIATION DOSE REDUCTION: This exam was performed according to the departmental dose-optimization program which includes automated exposure control, adjustment of the mA and/or kV according to patient size and/or use of iterative reconstruction technique. COMPARISON:  CT Head 10/29/21 FINDINGS: CT HEAD FINDINGS Brain: No evidence of acute infarction, hemorrhage, hydrocephalus, extra-axial collection or mass lesion/mass effect. Sequela of moderate to severe chronic microvascular ischemic change. Chronic right MCA territory infarct involving the insular region. There  is likely also a chronic infarct in the right thalamus. Vascular: No hyperdense vessel or unexpected calcification. Skull: Lipoma along the left frontal scalp. There is mild soft tissue swelling along the left frontal scalp. Other: None. CT MAXILLOFACIAL FINDINGS Osseous: Redemonstrated chronic fracture of the superior orbital rim on the right (series 9, image 58). 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 Orbits: Negative. No traumatic or inflammatory finding. Sinuses: No middle ear or mastoid effusion. OMC pattern sinusitis on left with complete opacification of the left sphenoid sinus and near-complete opacification of the left maxillary and ethmoid sinuses. Soft tissues: Negative. CT CERVICAL SPINE FINDINGS Alignment: Trace anterolisthesis of C4 on C5 and trace retrolisthesis of C5 on C6. Skull base and vertebrae: No acute fracture. No primary bone lesion or focal pathologic process. Soft tissues and spinal canal: No prevertebral fluid or swelling. No visible canal hematoma. Disc levels:  No CT evidence of high-grade spinal canal stenosis. Upper chest: Negative. Other: None IMPRESSION: 1. No acute intracranial abnormality. 2. No acute facial bone fracture. 3. No acute fracture or traumatic listhesis in the cervical spine. 4. OMC pattern sinusitis on the left. 5. 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 Electronically Signed   By: Lorenza Cambridge M.D.   On: 02/21/2023 17:59    Procedures Procedures    Medications Ordered in ED Medications - No data to display  ED Course/ Medical Decision Making/ A&P                          Medical Decision Making Amount and/or Complexity of Data Reviewed Labs: ordered. Decision-making details documented in ED Course. Radiology: ordered. ECG/medicine tests: ordered.    This patient presents to the ED for concern of fall, head trauma on blood thinners, this involves an  extensive number of treatment options, and is a complaint that carries with it a high risk of complications and morbidity.  I considered the following differential and admission for this acute, potentially life threatening condition.   MDM:    DDX for trauma includes but is not limited to:  -Head Injury such as skull fx or ICH - has forehead trauma w/ small hematomas on forehead -Chest Injury and Abdominal Injury - including hemo/pneumothorax, cardiac, abdominal solid and hollow organ injury -Spinal Cord or Vertebral injury -patient has baseline left upper extremity deficits and does not appear to have any additional deficits -Fractures - Consider rib fractures, hand fractures given exam.  -He reports he is otherwise in his [facility] -as he is unclear if he lost consciousness or what made him fall will get a basic set of labs including urine to assess for electrolyte derangements, hypo-/hyperglycemia anemia, UTI.   Clinical Course as of 02/23/23 1521  Wed Feb 21, 2023  1809 CBC WITH DIFFERENTIAL(!) Unremarkable in the context of  this patient's presentation  [HN]  2148 Delay in obtaining radiology reads online, however radiology reads are able to be printed by radiology.  They demonstrate no acute intracranial abnormality, no acute facial bone fracture, no acute fracture or traumatic listhesis in the cervical spine.  OMC pattern sinusitis on the left.  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  Right knee x-ray is negative, pelvis x-ray is negative, chest x-ray demonstrates suspected nondisplaced lateral left sixth and seventh rib fractures and no pneumothorax.  Portable left knee x-ray is negative, right shoulder x-ray is negative, left hand x-ray with limited evaluation but no definitive fracture is seen, left wrist limited evaluation no definitive fracture is seen. [HN]  2333 D/w patient. He does not have any rhinorrhea or facial pain/headache, no f/c, no  sx of active acute bacterial sinusitis. He is informed of his rib fractures and dental caries. He is informed that he needs to follow with a dentist as well as his prior primary care physician.  He is informed of his left sixth and seventh rib fractures but he reports that he does not have any chest pain or trouble breathing at this time.  He is not hypoxic or tachypneic.  Patient will be discharged back to his facility with instructions to take Tylenol for pain and follow-up with PCP and dentist.  Patient reports understanding.  All questions answered to patient satisfaction. [HN]    Clinical Course User Index [HN] Loetta Rough, MD    Labs: I Ordered, and personally interpreted labs.  The pertinent results include: Glucose 236, CMP otherwise unremarkable in the context of this patient's presentation.  CBC unremarkable.  UA with no signs of infection.  Imaging Studies ordered: I ordered imaging studies including CTH, CT face, CT C-spine, XRs I independently visualized and interpreted imaging. I agree with the radiologist interpretation  Additional history obtained from EMS, chart review.    Cardiac Monitoring: The patient was maintained on a cardiac monitor.  I personally viewed and interpreted the cardiac monitored which showed an underlying rhythm of: NSR  Reevaluation: I reevaluated the patient and found that they have :improved  Social Determinants of Health: Lives in ALF  Disposition:  DC w/ discharge instructions/return precautions. All questions answered to patient's satisfaction.    Co morbidities that complicate the patient evaluation  Past Medical History:  Diagnosis Date   Diabetes mellitus without complication (HCC)    Hypertension    Insomnia    Pancytopenia (HCC)    Stenosis of carotid artery    Stroke (HCC)    Unsteady gait      Medicines No orders of the defined types were placed in this encounter.   I have reviewed the patients home medicines and have  made adjustments as needed  Problem List / ED Course: Problem List Items Addressed This Visit   None Visit Diagnoses     Fall in home, initial encounter    -  Primary   Contusion of face, initial encounter       Dental caries       Closed fracture of multiple ribs of left side, initial encounter                       This note was created using dictation software, which may contain spelling or grammatical errors.    Loetta Rough, MD 02/23/23 [id number]

## 2023-02-21 NOTE — Discharge Instructions (Addendum)
Thank you for coming to [facility]. You were seen for fall at home, head trauma. We did an exam, labs, and imaging, and these showed multiple dental cavities that need attention by a dentist.  You are also found to have 2 rib fractures, left sixth and seventh ribs.  If you develop any significant chest pain or trouble breathing please return to the emergency department. Please follow up with your primary care provider within 1 week.  Please also be seen by dentist.  You can take Tylenol 1000 mg every 8 hours for pain.  Do not hesitate to return to the ED or call 911 if you experience: -Worsening symptoms -Chest pain, shortness of breath -Trouble breathing -Lightheadedness, passing out -Fevers/chills -Anything else that concerns you

## 2023-02-22 NOTE — ED Notes (Signed)
PTAR at bedside 

## 2023-09-27 ENCOUNTER — Other Ambulatory Visit (HOSPITAL_BASED_OUTPATIENT_CLINIC_OR_DEPARTMENT_OTHER): Payer: Self-pay | Admitting: Family Medicine

## 2023-09-27 DIAGNOSIS — R634 Abnormal weight loss: Secondary | ICD-10-CM

## 2023-09-27 DIAGNOSIS — F17200 Nicotine dependence, unspecified, uncomplicated: Secondary | ICD-10-CM

## 2023-09-28 ENCOUNTER — Ambulatory Visit (HOSPITAL_BASED_OUTPATIENT_CLINIC_OR_DEPARTMENT_OTHER)
Admission: RE | Admit: 2023-09-28 | Discharge: 2023-09-28 | Disposition: A | Discharge status: Home / Self Care | Source: Ambulatory Visit | Attending: Family Medicine | Admitting: Family Medicine

## 2023-09-28 DIAGNOSIS — I700 Atherosclerosis of aorta: Secondary | ICD-10-CM | POA: Diagnosis not present

## 2023-09-28 DIAGNOSIS — F17200 Nicotine dependence, unspecified, uncomplicated: Secondary | ICD-10-CM | POA: Diagnosis present

## 2023-09-28 DIAGNOSIS — R634 Abnormal weight loss: Secondary | ICD-10-CM | POA: Insufficient documentation

## 2023-09-28 DIAGNOSIS — R918 Other nonspecific abnormal finding of lung field: Secondary | ICD-10-CM | POA: Insufficient documentation

## 2023-09-28 MED ORDER — IOHEXOL 300 MG/ML  SOLN
75.0000 mL | Freq: Once | INTRAMUSCULAR | Status: AC | PRN
  Administered 2023-09-28: 75 mL via INTRAVENOUS
